# Patient Record
Sex: Male | Born: 1980 | Race: Black or African American | Hispanic: No | Marital: Single | Smoking: Current every day smoker
Health system: Southern US, Community
[De-identification: ages and names within clinical notes are randomized; demographics above are authoritative.]

## PROBLEM LIST (undated history)

## (undated) DIAGNOSIS — I10 Essential (primary) hypertension: Secondary | ICD-10-CM

---
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
## 2002-12-03 ENCOUNTER — Emergency Department (HOSPITAL_COMMUNITY): Admission: EM | Admit: 2002-12-03 | Discharge: 2002-12-03 | Payer: Self-pay | Admitting: Emergency Medicine

## 2009-10-01 ENCOUNTER — Emergency Department (HOSPITAL_COMMUNITY): Admission: EM | Admit: 2009-10-01 | Discharge: 2009-10-01 | Payer: Self-pay | Admitting: Emergency Medicine

## 2010-07-25 ENCOUNTER — Emergency Department (HOSPITAL_COMMUNITY)
Admission: EM | Admit: 2010-07-25 | Discharge: 2010-07-26 | Payer: Self-pay | Source: Home / Self Care | Admitting: Emergency Medicine

## 2010-07-26 LAB — HEPATIC FUNCTION PANEL
ALT: 30 U/L (ref 0–53)
AST: 24 U/L (ref 0–37)
Albumin: 4.2 g/dL (ref 3.5–5.2)
Alkaline Phosphatase: 78 U/L (ref 39–117)
Bilirubin, Direct: <0.1 mg/dL (ref 0.0–0.3)
Total Bilirubin: 0.5 mg/dL (ref 0.3–1.2)
Total Protein: 7.6 g/dL (ref 6.0–8.3)

## 2010-07-26 LAB — DIFFERENTIAL
Basophils Absolute: 0 10*3/uL (ref 0.0–0.1)
Basophils Relative: 0 % (ref 0–1)
Eosinophils Absolute: 0.1 10*3/uL (ref 0.0–0.7)
Eosinophils Relative: 1 % (ref 0–5)
Lymphocytes Relative: 33 % (ref 12–46)
Lymphs Abs: 3.8 10*3/uL (ref 0.7–4.0)
Monocytes Absolute: 0.7 10*3/uL (ref 0.1–1.0)
Monocytes Relative: 6 % (ref 3–12)
Neutro Abs: 6.9 10*3/uL (ref 1.7–7.7)
Neutrophils Relative %: 60 % (ref 43–77)

## 2010-07-26 LAB — BASIC METABOLIC PANEL
BUN: 6 mg/dL (ref 6–23)
CO2: 22 mEq/L (ref 19–32)
Calcium: 9.2 mg/dL (ref 8.4–10.5)
Chloride: 104 mEq/L (ref 96–112)
Creatinine, Ser: 1.02 mg/dL (ref 0.4–1.5)
GFR calc Af Amer: >60 mL/min (ref >60)
GFR calc non Af Amer: >60 mL/min (ref >60)
Glucose, Bld: 83 mg/dL (ref 70–99)
Potassium: 4.1 mEq/L (ref 3.5–5.1)
Sodium: 137 mEq/L (ref 135–145)

## 2010-07-26 LAB — POCT CARDIAC MARKERS
CKMB, poc: <1 ng/mL — ABNORMAL LOW (ref 1.0–8.0)
Myoglobin, poc: 54.1 ng/mL (ref 12–200)
Troponin i, poc: <0.05 ng/mL (ref 0.00–0.09)

## 2010-07-26 LAB — CBC
HCT: 44.1 % (ref 39.0–52.0)
Hemoglobin: 16.1 g/dL (ref 13.0–17.0)
MCH: 30.5 pg (ref 26.0–34.0)
MCHC: 36.5 g/dL — ABNORMAL HIGH (ref 30.0–36.0)
MCV: 83.5 fL (ref 78.0–100.0)
Platelets: ADEQUATE 10*3/uL (ref 150–400)
RBC: 5.28 MIL/uL (ref 4.22–5.81)
RDW: 12.6 % (ref 11.5–15.5)
WBC: 11.5 10*3/uL — ABNORMAL HIGH (ref 4.0–10.5)

## 2010-07-26 LAB — LIPASE, BLOOD: Lipase: 21 U/L (ref 11–59)

## 2010-10-01 LAB — LIPASE, BLOOD: Lipase: 16 U/L (ref 11–59)

## 2010-10-01 LAB — CBC
HCT: 45.2 % (ref 39.0–52.0)
Hemoglobin: 15.8 g/dL (ref 13.0–17.0)
MCHC: 35 g/dL (ref 30.0–36.0)
MCV: 89.1 fL (ref 78.0–100.0)
Platelets: 221 10*3/uL (ref 150–400)
RBC: 5.07 MIL/uL (ref 4.22–5.81)
RDW: 13.3 % (ref 11.5–15.5)
WBC: 8.2 10*3/uL (ref 4.0–10.5)

## 2010-10-01 LAB — COMPREHENSIVE METABOLIC PANEL
ALT: 19 U/L (ref 0–53)
AST: 24 U/L (ref 0–37)
Albumin: 3.8 g/dL (ref 3.5–5.2)
Alkaline Phosphatase: 74 U/L (ref 39–117)
BUN: 8 mg/dL (ref 6–23)
CO2: 24 mEq/L (ref 19–32)
Calcium: 8.8 mg/dL (ref 8.4–10.5)
Chloride: 102 mEq/L (ref 96–112)
Creatinine, Ser: 1.17 mg/dL (ref 0.4–1.5)
GFR calc Af Amer: >60 mL/min (ref >60)
GFR calc non Af Amer: >60 mL/min (ref >60)
Glucose, Bld: 100 mg/dL — ABNORMAL HIGH (ref 70–99)
Potassium: 3.9 mEq/L (ref 3.5–5.1)
Sodium: 133 mEq/L — ABNORMAL LOW (ref 135–145)
Total Bilirubin: 0.8 mg/dL (ref 0.3–1.2)
Total Protein: 7.2 g/dL (ref 6.0–8.3)

## 2010-10-01 LAB — POCT CARDIAC MARKERS
CKMB, poc: <1 ng/mL — ABNORMAL LOW (ref 1.0–8.0)
CKMB, poc: <1 ng/mL — ABNORMAL LOW (ref 1.0–8.0)
Myoglobin, poc: 59.8 ng/mL (ref 12–200)
Myoglobin, poc: 71.7 ng/mL (ref 12–200)
Troponin i, poc: <0.05 ng/mL (ref 0.00–0.09)
Troponin i, poc: <0.05 ng/mL (ref 0.00–0.09)

## 2010-10-01 LAB — DIFFERENTIAL
Basophils Absolute: 0 10*3/uL (ref 0.0–0.1)
Basophils Relative: 0 % (ref 0–1)
Eosinophils Absolute: 0 10*3/uL (ref 0.0–0.7)
Eosinophils Relative: 0 % (ref 0–5)
Lymphocytes Relative: 17 % (ref 12–46)
Lymphs Abs: 1.4 10*3/uL (ref 0.7–4.0)
Monocytes Absolute: 0.5 10*3/uL (ref 0.1–1.0)
Monocytes Relative: 6 % (ref 3–12)
Neutro Abs: 6.3 10*3/uL (ref 1.7–7.7)
Neutrophils Relative %: 77 % (ref 43–77)

## 2015-02-08 ENCOUNTER — Encounter (HOSPITAL_COMMUNITY): Payer: Self-pay | Admitting: Emergency Medicine

## 2015-02-08 ENCOUNTER — Emergency Department (INDEPENDENT_AMBULATORY_CARE_PROVIDER_SITE_OTHER)
Admission: EM | Admit: 2015-02-08 | Discharge: 2015-02-08 | Disposition: A | Discharge status: Home / Self Care | Payer: 59 | Source: Home / Self Care | Attending: Emergency Medicine | Admitting: Emergency Medicine

## 2015-02-08 DIAGNOSIS — R109 Unspecified abdominal pain: Secondary | ICD-10-CM

## 2015-02-08 DIAGNOSIS — R319 Hematuria, unspecified: Secondary | ICD-10-CM | POA: Diagnosis not present

## 2015-02-08 LAB — POCT URINALYSIS DIP (DEVICE)
Bilirubin Urine: NEGATIVE
Glucose, UA: NEGATIVE mg/dL
Ketones, ur: NEGATIVE mg/dL
Leukocytes, UA: NEGATIVE
Nitrite: NEGATIVE
Protein, ur: 30 mg/dL — AB
Specific Gravity, Urine: >=1.03 (ref 1.005–1.030)
Urobilinogen, UA: 0.2 mg/dL (ref 0.0–1.0)
pH: 6 (ref 5.0–8.0)

## 2015-02-08 MED ORDER — ONDANSETRON HCL 4 MG PO TABS: 4.0000 mg | ORAL_TABLET | Freq: Three times a day (TID) | ORAL | Status: DC | PRN

## 2015-02-08 MED ORDER — HYDROCODONE-ACETAMINOPHEN 5-325 MG PO TABS
1.0000 | ORAL_TABLET | Freq: Once | ORAL | Status: AC
  Administered 2015-02-08: 1 via ORAL

## 2015-02-08 MED ORDER — HYDROCODONE-ACETAMINOPHEN 5-325 MG PO TABS: 1.0000 | ORAL_TABLET | Freq: Four times a day (QID) | ORAL | Status: DC | PRN

## 2015-02-08 MED ORDER — ONDANSETRON 4 MG PO TBDP
4.0000 mg | ORAL_TABLET | Freq: Once | ORAL | Status: AC
  Administered 2015-02-08: 4 mg via ORAL

## 2015-02-08 MED ORDER — IBUPROFEN 800 MG PO TABS: 800.0000 mg | ORAL_TABLET | Freq: Three times a day (TID) | ORAL | Status: DC | PRN

## 2015-02-08 MED ORDER — HYDROCODONE-ACETAMINOPHEN 5-325 MG PO TABS
ORAL_TABLET | ORAL | Status: AC
  Filled 2015-02-08: qty 1

## 2015-02-08 MED ORDER — ONDANSETRON 4 MG PO TBDP
ORAL_TABLET | ORAL | Status: AC
  Filled 2015-02-08: qty 1

## 2015-02-08 MED ORDER — TAMSULOSIN HCL 0.4 MG PO CAPS: 0.4000 mg | ORAL_CAPSULE | Freq: Every day | ORAL | Status: DC

## 2015-02-08 NOTE — ED Notes (Signed)
C/o abd pain  States right back radiating lower stomach pain Does have nausea and urinating problems

## 2015-02-08 NOTE — Discharge Instructions (Signed)
You likely have a kidney stone. Use the Zofran every 8 hours as needed for nausea and vomiting. Take ibuprofen 800 mg 3 times a day as needed for pain. Use the Norco every 4-6 hours as needed for severe pain. Take Flomax daily for the next 2 weeks to help the stone pass. If your symptoms have not resolved in 1-2 weeks or are getting worse, please go to the emergency room.

## 2015-02-08 NOTE — ED Provider Notes (Signed)
CSN: [id number]     Arrival date & time 02/08/15  1632 History   First MD Initiated Contact with Patient 02/08/15 1642     Chief Complaint  Patient presents with  . Abdominal Pain   (Consider location/radiation/quality/duration/timing/severity/associated sxs/prior Treatment) HPI  He is a 34 year old man here for evaluation of abdominal pain.  He states this started this morning with pain in the left back that radiated around to the left stomach. Pain comes and goes. He describes it as relatively mild. This is associated with nausea and vomiting. He also reports a smaller than usual amount of urine this morning. No fevers or chills. No dysuria or gross hematuria.  History reviewed. No pertinent past medical history. No past surgical history on file. History reviewed. No pertinent family history. History  Substance Use Topics  . Smoking status: Not on file  . Smokeless tobacco: Not on file  . Alcohol Use: Not on file    Review of Systems As in history of present illness Allergies  Review of patient's allergies indicates no known allergies.  Home Medications   Prior to Admission medications   Medication Sig Start Date End Date Taking? Authorizing Provider  HYDROcodone-acetaminophen (NORCO) 5-325 MG per tablet Take 1 tablet by mouth every 6 (six) hours as needed for moderate pain. 02/08/15   Charm Rings, MD  ibuprofen (ADVIL,MOTRIN) 800 MG tablet Take 1 tablet (800 mg total) by mouth every 8 (eight) hours as needed for moderate pain. 02/08/15   Charm Rings, MD  ondansetron (ZOFRAN) 4 MG tablet Take 1 tablet (4 mg total) by mouth every 8 (eight) hours as needed for nausea or vomiting. 02/08/15   Charm Rings, MD  tamsulosin (FLOMAX) 0.4 MG CAPS capsule Take 1 capsule (0.4 mg total) by mouth daily. 02/08/15   Charm Rings, MD   BP 146/99 mmHg  Pulse 88  Temp(Src) 98.5 F (36.9 C) (Oral)  Resp 16  SpO2 99% Physical Exam  Constitutional: He is oriented to person, place, and time. He  appears well-developed and well-nourished. No distress.  Neck: Neck supple.  Cardiovascular: Normal rate, regular rhythm and normal heart sounds.   No murmur heard. Pulmonary/Chest: Effort normal and breath sounds normal. No respiratory distress. He has no wheezes. He has no rales.  Abdominal: Soft. Bowel sounds are normal. He exhibits no distension. There is tenderness (along left abdomen). There is no rebound and no guarding.  Mild CVA tenderness on left  Neurological: He is alert and oriented to person, place, and time.    ED Course  Procedures (including critical care time) Labs Review Labs Reviewed  POCT URINALYSIS DIP (DEVICE) - Abnormal; Notable for the following:    Hgb urine dipstick SMALL (*)    Protein, ur 30 (*)    All other components within normal limits    Imaging Review No results found.   MDM   1. Left flank pain   2. Hematuria    I suspect he has a kidney stone. Zofran 4 mg ODT and Norco 5-325 milligrams by mouth given. Prescriptions for Zofran, ibuprofen, Norco, Flomax given. Return precautions reviewed.    Charm Rings, MD 02/08/15 [phone]

## 2015-04-21 ENCOUNTER — Emergency Department (INDEPENDENT_AMBULATORY_CARE_PROVIDER_SITE_OTHER)
Admission: EM | Admit: 2015-04-21 | Discharge: 2015-04-21 | Disposition: A | Discharge status: Home / Self Care | Payer: 59 | Source: Home / Self Care

## 2015-04-21 ENCOUNTER — Encounter (HOSPITAL_COMMUNITY): Payer: Self-pay | Admitting: *Deleted

## 2015-04-21 DIAGNOSIS — K529 Noninfective gastroenteritis and colitis, unspecified: Secondary | ICD-10-CM | POA: Diagnosis not present

## 2015-04-21 LAB — POCT URINALYSIS DIP (DEVICE)
Glucose, UA: NEGATIVE mg/dL
Ketones, ur: NEGATIVE mg/dL
Leukocytes, UA: NEGATIVE
Nitrite: NEGATIVE
Protein, ur: 30 mg/dL — AB
Specific Gravity, Urine: >=1.03 (ref 1.005–1.030)
Urobilinogen, UA: 0.2 mg/dL (ref 0.0–1.0)
pH: 6 (ref 5.0–8.0)

## 2015-04-21 NOTE — Discharge Instructions (Signed)

## 2015-04-21 NOTE — ED Notes (Signed)
Pt  Reports  Symptoms  Of abdominal  Pain       Nausea           Vomiting  And  Some  Slight  Diarrhea         Pt  Reports  The  Nausea  Began  Yesterday

## 2015-04-21 NOTE — ED Provider Notes (Signed)
CSN: [id number]     Arrival date & time 04/21/15  1733 History   None    Chief Complaint  Patient presents with  . Abdominal Pain   (Consider location/radiation/quality/duration/timing/severity/associated sxs/prior Treatment) HPI Comments: 34 year old African-American male presents to urgent care complaining of abdominal pain associated with 2 episodes of loose stool that started today. Notably the patient states that he began to feel nauseous yesterday and awoke this morning with 1 episode of nonbloody nonbilious emesis. He also admits to urinary hesitancy but denies dysuria. The patient describes white streaking to his urine that "looks like a warm". His abdominal pain alternates between sharp and dull. She states that it feels slightly different than pain he experienced in the past with kidney stones. He denies fevers.  Patient is a 34 y.o. male presenting with abdominal pain. The history is provided by the patient.  Abdominal Pain Pain location:  Suprapubic Pain radiates to:  Does not radiate Pain severity:  Moderate Onset quality: Colicky. Progression:  Worsening (Gradually) Chronicity:  New Relieved by:  None tried Worsened by:  Nothing tried Associated symptoms: diarrhea, nausea and vomiting   Associated symptoms: no chest pain, no chills, no dysuria, no fever, no hematuria and no shortness of breath   Diarrhea:    Diarrhea characteristics: Nonbloody.   Number of occurrences:  2   History reviewed. No pertinent past medical history. History reviewed. No pertinent past surgical history. History reviewed. No pertinent family history. Social History  Substance Use Topics  . Smoking status: Current Every Day Smoker  . Smokeless tobacco: None  . Alcohol Use: Yes    Review of Systems  Constitutional: Negative for fever and chills.  Respiratory: Negative for shortness of breath.   Cardiovascular: Negative for chest pain.  Gastrointestinal: Positive for nausea, vomiting,  abdominal pain and diarrhea.  Genitourinary: Negative for dysuria, hematuria and flank pain.  Musculoskeletal: Negative for arthralgias.  Skin: Negative for rash.    Allergies  Review of patient's allergies indicates no known allergies.  Home Medications   Prior to Admission medications   Medication Sig Start Date End Date Taking? Authorizing Provider  HYDROcodone-acetaminophen (NORCO) 5-325 MG per tablet Take 1 tablet by mouth every 6 (six) hours as needed for moderate pain. 02/08/15   Charm RingsErin J Honig, MD  ibuprofen (ADVIL,MOTRIN) 800 MG tablet Take 1 tablet (800 mg total) by mouth every 8 (eight) hours as needed for moderate pain. 02/08/15   Charm RingsErin J Honig, MD  ondansetron (ZOFRAN) 4 MG tablet Take 1 tablet (4 mg total) by mouth every 8 (eight) hours as needed for nausea or vomiting. 02/08/15   Charm RingsErin J Honig, MD  tamsulosin (FLOMAX) 0.4 MG CAPS capsule Take 1 capsule (0.4 mg total) by mouth daily. 02/08/15   Charm RingsErin J Honig, MD   Meds Ordered and Administered this Visit  Medications - No data to display  BP 142/93 mmHg  Pulse 84  Temp(Src) 98.1 F (36.7 C) (Oral)  Resp 16  SpO2 100% No data found.   Physical Exam  ED Course  Procedures (including critical care time)  Labs Review Labs Reviewed  POCT URINALYSIS DIP (DEVICE) - Abnormal; Notable for the following:    Bilirubin Urine SMALL (*)    Hgb urine dipstick TRACE (*)    Protein, ur 30 (*)    All other components within normal limits    Imaging Review No results found.   Visual Acuity Review  Right Eye Distance:   Left Eye Distance:   Bilateral  Distance:    Right Eye Near:   Left Eye Near:    Bilateral Near:         MDM   1. Gastroenteritis    BRAT diet; drink plenty of water. Return to clinic if you are unable to keep food or water down for 36 hours. May use Imodium for symptom relief as diarrhea is unlikely bacterial.    Arnaldo Natal, MD 04/21/15 Ernestina [place]

## 2015-05-06 ENCOUNTER — Emergency Department (INDEPENDENT_AMBULATORY_CARE_PROVIDER_SITE_OTHER)
Admission: EM | Admit: 2015-05-06 | Discharge: 2015-05-06 | Disposition: A | Discharge status: Home / Self Care | Payer: 59 | Source: Home / Self Care | Attending: Family Medicine | Admitting: Family Medicine

## 2015-05-06 ENCOUNTER — Encounter (HOSPITAL_COMMUNITY): Payer: Self-pay | Admitting: Emergency Medicine

## 2015-05-06 DIAGNOSIS — A084 Viral intestinal infection, unspecified: Secondary | ICD-10-CM

## 2015-05-06 MED ORDER — ONDANSETRON 4 MG PO TBDP
ORAL_TABLET | ORAL | Status: AC
  Filled 2015-05-06: qty 2

## 2015-05-06 MED ORDER — ONDANSETRON HCL 4 MG PO TABS: 4.0000 mg | ORAL_TABLET | Freq: Three times a day (TID) | ORAL | Status: DC | PRN

## 2015-05-06 MED ORDER — ONDANSETRON 4 MG PO TBDP
8.0000 mg | ORAL_TABLET | Freq: Once | ORAL | Status: AC
  Administered 2015-05-06: 8 mg via ORAL

## 2015-05-06 NOTE — Discharge Instructions (Signed)
You likely have developed a viral got infection called gastroenteritis. This typically goes away within 1-7 days total. Please use the Zofran as needed for upset stomach and vomiting. Please stay hydrated with plenty of fluids especially electrolyte fluids. His go to the emergency room if your symptoms get significantly worse.   Viral Gastroenteritis Viral gastroenteritis is also known as stomach flu. This condition affects the stomach and intestinal tract. It can cause sudden diarrhea and vomiting. The illness typically lasts 3 to 8 days. Most people develop an immune response that eventually gets rid of the virus. While this natural response develops, the virus can make you quite ill. CAUSES  Many different viruses can cause gastroenteritis, such as rotavirus or noroviruses. You can catch one of these viruses by consuming contaminated food or water. You may also catch a virus by sharing utensils or other personal items with an infected person or by touching a contaminated surface. SYMPTOMS  The most common symptoms are diarrhea and vomiting. These problems can cause a severe loss of body fluids (dehydration) and a body salt (electrolyte) imbalance. Other symptoms may include:  Fever.  Headache.  Fatigue.  Abdominal pain. DIAGNOSIS  Your caregiver can usually diagnose viral gastroenteritis based on your symptoms and a physical exam. A stool sample may also be taken to test for the presence of viruses or other infections. TREATMENT  This illness typically goes away on its own. Treatments are aimed at rehydration. The most serious cases of viral gastroenteritis involve vomiting so severely that you are not able to keep fluids down. In these cases, fluids must be given through an intravenous line (IV). HOME CARE INSTRUCTIONS   Drink enough fluids to keep your urine clear or pale yellow. Drink small amounts of fluids frequently and increase the amounts as tolerated.  Ask your caregiver for  specific rehydration instructions.  Avoid:  Foods high in sugar.  Alcohol.  Carbonated drinks.  Tobacco.  Juice.  Caffeine drinks.  Extremely hot or cold fluids.  Fatty, greasy foods.  Too much intake of anything at one time.  Dairy products until 24 to 48 hours after diarrhea stops.  You may consume probiotics. Probiotics are active cultures of beneficial bacteria. They may lessen the amount and number of diarrheal stools in adults. Probiotics can be found in yogurt with active cultures and in supplements.  Wash your hands well to avoid spreading the virus.  Only take over-the-counter or prescription medicines for pain, discomfort, or fever as directed by your caregiver. Do not give aspirin to children. Antidiarrheal medicines are not recommended.  Ask your caregiver if you should continue to take your regular prescribed and over-the-counter medicines.  Keep all follow-up appointments as directed by your caregiver. SEEK IMMEDIATE MEDICAL CARE IF:   You are unable to keep fluids down.  You do not urinate at least once every 6 to 8 hours.  You develop shortness of breath.  You notice blood in your stool or vomit. This may look like coffee grounds.  You have abdominal pain that increases or is concentrated in one small area (localized).  You have persistent vomiting or diarrhea.  You have a fever.  The patient is a child younger than 3 months, and he or she has a fever.  The patient is a child older than 3 months, and he or she has a fever and persistent symptoms.  The patient is a child older than 3 months, and he or she has a fever and symptoms suddenly get  worse.  The patient is a baby, and he or she has no tears when crying. MAKE SURE YOU:   Understand these instructions.  Will watch your condition.  Will get help right away if you are not doing well or get worse.   This information is not intended to replace advice given to you by your health care  provider. Make sure you discuss any questions you have with your health care provider.   Document Released: 06/25/2005 Document Revised: 09/17/2011 Document Reviewed: 04/11/2011 Elsevier Interactive Patient Education [employer]2016 [employer]

## 2015-05-06 NOTE — ED Notes (Addendum)
Pt here today with mid lower abdominal pain, nausea and vomiting x2 this morning.  He still has the pain this afternoon.  He states his last BM was last night, regular and formed.  He denies any other issues.  He woke up with the pain, ate breakfast and vomited twice.  He has been able to keep Gatorade down.

## 2015-05-06 NOTE — ED Provider Notes (Signed)
CSN: [id number]     Arrival date & time 05/06/15  1327 [id number]   First MD Initiated Contact with Patient 05/06/15 1414     Chief Complaint  Patient presents with  . Abdominal Pain  . Emesis  . Nausea   (Consider location/radiation/quality/duration/timing/severity/associated sxs/prior Treatment) HPI   Abdominal discomfort, nausea, vomiting, and diarrhea. Started 1 day ago. Intermittent. Getting worse. Emesis with all oral intake. Able to tolerate only small amounts of Gatorade. Denies any bloody stools, hematemesis, fevers, dysuria, frequency. Patient still with slight suprapubic tenderness from previous urinary stone. Denies chest pain, shortness breath, palpitations, neck stiffness, headache.   History reviewed. No pertinent past medical history. History reviewed. No pertinent past surgical history. History reviewed. No pertinent family history. Social History  Substance Use Topics  . Smoking status: Current Every Day Smoker  . Smokeless tobacco: None  . Alcohol Use: Yes    Review of Systems   Per HPI with all other pertinent systems negative.   Allergies  Review of patient's allergies indicates no known allergies.  Home Medications   Prior to Admission medications   Medication Sig Start Date End Date Taking? Authorizing Provider  HYDROcodone-acetaminophen (NORCO) 5-325 MG per tablet Take 1 tablet by mouth every 6 (six) hours as needed for moderate pain. 02/08/15   Charm RingsErin J Honig, MD  ibuprofen (ADVIL,MOTRIN) 800 MG tablet Take 1 tablet (800 mg total) by mouth every 8 (eight) hours as needed for moderate pain. 02/08/15   Charm RingsErin J Honig, MD  ondansetron (ZOFRAN) 4 MG tablet Take 1-2 tablets (4-8 mg total) by mouth every 8 (eight) hours as needed for nausea or vomiting. 05/06/15   Ozella Rocksavid J Merrell, MD  tamsulosin (FLOMAX) 0.4 MG CAPS capsule Take 1 capsule (0.4 mg total) by mouth daily. 02/08/15   Charm RingsErin J Honig, MD   Meds Ordered and Administered this Visit   Medications  ondansetron  (ZOFRAN-ODT) disintegrating tablet 8 mg (not administered)    BP 156/102 mmHg  Pulse 82  Temp(Src) 98.3 F (36.8 C) (Oral)  Resp 16  SpO2 99% No data found.   Physical Exam Physical Exam  Constitutional: oriented to person, place, and time. appears well-developed and well-nourished. No distress.  HENT:  Head: Normocephalic and atraumatic.  Eyes: EOMI. PERRL.  Neck: Normal range of motion.  Cardiovascular: RRR, no m/r/g, 2+ distal pulses,  Pulmonary/Chest: Effort normal and breath sounds normal. No respiratory distress.  Abdominal: Soft. Bowel sounds are normal. NonTTP, no distension.  Musculoskeletal: Normal range of motion. Non ttp, no effusion.  Neurological: alert and oriented to person, place, and time.  Skin: Skin is warm. No rash noted. non diaphoretic.  Psychiatric: normal mood and affect. behavior is normal. Judgment and thought content normal.   ED Course  Procedures (including critical care time)  Labs Review Labs Reviewed - No data to display  Imaging Review No results found.   Visual Acuity Review  Right Eye Distance:   Left Eye Distance:   Bilateral Distance:    Right Eye Near:   Left Eye Near:    Bilateral Near:         MDM   1. Viral gastroenteritis    Zofran 8 mg ODT given in clinic. Continue Zofran, fluids, rest. Discussed how this condition will likely resolve on its own over the course of the next 1-5 days. Provided. No evidence of recurrence or continuing. Elevation blood pressure noted secondary to patient's acute illness. Further management at this time.   Ozella Rocksavid J Merrell,  MD 05/06/15 1443

## 2015-08-14 ENCOUNTER — Other Ambulatory Visit (HOSPITAL_COMMUNITY)
Admission: RE | Admit: 2015-08-14 | Discharge: 2015-08-14 | Disposition: A | Discharge status: Home / Self Care | Payer: 59 | Source: Ambulatory Visit | Attending: Family Medicine | Admitting: Family Medicine

## 2015-08-14 ENCOUNTER — Emergency Department (INDEPENDENT_AMBULATORY_CARE_PROVIDER_SITE_OTHER)
Admission: EM | Admit: 2015-08-14 | Discharge: 2015-08-14 | Disposition: A | Discharge status: Home / Self Care | Payer: 59 | Source: Home / Self Care | Attending: Family Medicine | Admitting: Family Medicine

## 2015-08-14 DIAGNOSIS — Z113 Encounter for screening for infections with a predominantly sexual mode of transmission: Secondary | ICD-10-CM | POA: Diagnosis not present

## 2015-08-14 DIAGNOSIS — R1013 Epigastric pain: Secondary | ICD-10-CM

## 2015-08-14 DIAGNOSIS — R369 Urethral discharge, unspecified: Secondary | ICD-10-CM

## 2015-08-14 LAB — POCT URINALYSIS DIP (DEVICE)
Glucose, UA: NEGATIVE mg/dL
Ketones, ur: NEGATIVE mg/dL
Leukocytes, UA: NEGATIVE
NITRITE: NEGATIVE
PH: 6 (ref 5.0–8.0)
PROTEIN: NEGATIVE mg/dL
Specific Gravity, Urine: 1.025 (ref 1.005–1.030)
Urobilinogen, UA: 0.2 mg/dL (ref 0.0–1.0)

## 2015-08-14 MED ORDER — PANTOPRAZOLE SODIUM 20 MG PO TBEC: 20.0000 mg | DELAYED_RELEASE_TABLET | Freq: Every day | ORAL | Status: DC

## 2015-08-14 NOTE — Discharge Instructions (Signed)
It was nice seeing you today. I am sorry about your belly pain. You likely have gastritis causing your pain. Please start Prilosec for 4 wks. If no improvement or if symptoms worsens see your PCP soon.

## 2015-08-14 NOTE — ED Provider Notes (Addendum)
CSN: [id number]     Arrival date & time 08/14/15  1434 History   First MD Initiated Contact with Patient 08/14/15 1543     Chief Complaint  Patient presents with  . Abdominal Pain   (Consider location/radiation/quality/duration/timing/severity/associated sxs/prior Treatment) Patient is a 35 y.o. male presenting with abdominal pain and penile discharge. The history is provided by the patient. No language interpreter was used.  Abdominal Pain Pain location: right middl quadrant. Pain quality: dull and sharp   Pain radiates to:  Does not radiate Pain severity:  Mild Onset quality:  Gradual Duration:  24 hours Timing:  Intermittent Progression:  Unchanged Chronicity:  New Context: sick contacts   Context: not recent illness and not suspicious food intake   Context comment:  He ate pizza night before. Co-worker had URI Associated symptoms: nausea and vomiting   Associated symptoms: no anorexia, no belching, no constipation, no diarrhea, no dysuria, no fever, no hematuria, no shortness of breath and no sore throat   Associated symptoms comment:  He had one episode of vomiting this morning and thereafter he had dry heaving episodes Penile Discharge This is a new problem. The current episode started 6 to 12 hours ago. Episode frequency: occured once today. The problem has been gradually improving. Associated symptoms include abdominal pain. Pertinent negatives include no shortness of breath. Nothing aggravates the symptoms. Nothing relieves the symptoms.  Denies recent unprotected sex. Had Gonorrhea few years ago.  No past medical history on file. No past surgical history on file. No family history on file. Social History  Substance Use Topics  . Smoking status: Current Every Day Smoker  . Smokeless tobacco: Not on file  . Alcohol Use: Yes    Review of Systems  Constitutional: Negative for fever.  HENT: Negative for sore throat.   Respiratory: Negative.  Negative for shortness of  breath.   Cardiovascular: Negative.   Gastrointestinal: Positive for nausea, vomiting and abdominal pain. Negative for diarrhea, constipation and anorexia.  Genitourinary: Positive for discharge. Negative for dysuria and hematuria.  All other systems reviewed and are negative.   Allergies  Review of patient's allergies indicates no known allergies.  Home Medications   Prior to Admission medications   Medication Sig Start Date End Date Taking? Authorizing Provider  HYDROcodone-acetaminophen (NORCO) 5-325 MG per tablet Take 1 tablet by mouth every 6 (six) hours as needed for moderate pain. 02/08/15   Charm Rings, MD  ibuprofen (ADVIL,MOTRIN) 800 MG tablet Take 1 tablet (800 mg total) by mouth every 8 (eight) hours as needed for moderate pain. 02/08/15   Charm Rings, MD  ondansetron (ZOFRAN) 4 MG tablet Take 1-2 tablets (4-8 mg total) by mouth every 8 (eight) hours as needed for nausea or vomiting. 05/06/15   Ozella Rocks, MD  tamsulosin (FLOMAX) 0.4 MG CAPS capsule Take 1 capsule (0.4 mg total) by mouth daily. 02/08/15   Charm Rings, MD   Meds Ordered and Administered this Visit  Medications - No data to display  BP 155/90 mmHg  Pulse 97  Temp(Src) 98.7 F (37.1 C) (Oral)  Resp 16  SpO2 99% No data found.   Physical Exam  Constitutional: He appears well-developed. No distress.  Cardiovascular: Normal rate, regular rhythm, normal heart sounds and intact distal pulses.   No murmur heard. Pulmonary/Chest: Effort normal and breath sounds normal. No respiratory distress. He has no wheezes.  Abdominal: Normal appearance. There is no hepatosplenomegaly. There is tenderness in the epigastric area. There is  no rebound.  Nursing note and vitals reviewed.   ED Course  Procedures (including critical care time)  Labs Review Labs Reviewed - No data to display  Imaging Review No results found.   Visual Acuity Review  Right Eye Distance:   Left Eye Distance:   Bilateral Distance:     Right Eye Near:   Left Eye Near:    Bilateral Near:     Urinalysis    Component Value Date/Time   LABSPEC >=1.030 04/21/2015 1822   PHURINE 6.0 04/21/2015 1822   GLUCOSEU NEGATIVE 04/21/2015 1822   HGBUR TRACE* 04/21/2015 1822   BILIRUBINUR SMALL* 04/21/2015 1822   KETONESUR NEGATIVE 04/21/2015 1822   PROTEINUR 30* 04/21/2015 1822   UROBILINOGEN 0.2 04/21/2015 1822   NITRITE NEGATIVE 04/21/2015 1822   LEUKOCYTESUR NEGATIVE 04/21/2015 1822         MDM  No diagnosis found. Epigastric abdominal pain  Penile discharge  Patient likely has gastritis. Protonix prescribed. Avoid trigger food. Follow up with PCP in few days if no improvement or sooner if symptoms worsen.   Risk for STI low since he uses protection regularly with his long term girl friend. Urine cytology obtained for GC/Chlamydia. UA also checked with trace blood. I will contact him with other test result.      Doreene Eland, MD 08/14/15 [phone]

## 2015-08-14 NOTE — ED Notes (Signed)
Patient complains of stomach ache nausea and some vomiting that Started this am

## 2015-08-15 LAB — URINE CYTOLOGY ANCILLARY ONLY
Chlamydia: NEGATIVE
Neisseria Gonorrhea: NEGATIVE

## 2015-08-20 ENCOUNTER — Telehealth (HOSPITAL_COMMUNITY): Payer: Self-pay | Admitting: Emergency Medicine

## 2015-08-20 NOTE — ED Notes (Signed)
Called pt and notified of recent lab results from visit 2/5 Pt ID'd properly... Reports feeling better and sx have subsided  Per Dr. Dayton Scrape,   Please let patient know that tests for chlamydia/gonorrhea were negative. LM   Adv pt if sx are not getting better to return  Education on safe sex given Pt verb understanding.

## 2015-09-21 ENCOUNTER — Emergency Department (HOSPITAL_COMMUNITY)
Admission: EM | Admit: 2015-09-21 | Discharge: 2015-09-22 | Disposition: A | Discharge status: Home / Self Care | Payer: 59 | Attending: Emergency Medicine | Admitting: Emergency Medicine

## 2015-09-21 ENCOUNTER — Emergency Department (HOSPITAL_COMMUNITY): Payer: 59

## 2015-09-21 ENCOUNTER — Encounter (HOSPITAL_COMMUNITY): Payer: Self-pay | Admitting: Emergency Medicine

## 2015-09-21 DIAGNOSIS — R079 Chest pain, unspecified: Secondary | ICD-10-CM | POA: Diagnosis not present

## 2015-09-21 DIAGNOSIS — R200 Anesthesia of skin: Secondary | ICD-10-CM | POA: Diagnosis not present

## 2015-09-21 DIAGNOSIS — R42 Dizziness and giddiness: Secondary | ICD-10-CM | POA: Insufficient documentation

## 2015-09-21 DIAGNOSIS — F17200 Nicotine dependence, unspecified, uncomplicated: Secondary | ICD-10-CM | POA: Diagnosis not present

## 2015-09-21 DIAGNOSIS — R0602 Shortness of breath: Secondary | ICD-10-CM | POA: Insufficient documentation

## 2015-09-21 DIAGNOSIS — R1013 Epigastric pain: Secondary | ICD-10-CM | POA: Diagnosis not present

## 2015-09-21 DIAGNOSIS — R110 Nausea: Secondary | ICD-10-CM | POA: Insufficient documentation

## 2015-09-21 DIAGNOSIS — Z79899 Other long term (current) drug therapy: Secondary | ICD-10-CM | POA: Insufficient documentation

## 2015-09-21 LAB — BASIC METABOLIC PANEL
ANION GAP: 10 (ref 5–15)
BUN: 12 mg/dL (ref 6–20)
CALCIUM: 9.7 mg/dL (ref 8.9–10.3)
CO2: 28 mmol/L (ref 22–32)
CREATININE: 1.1 mg/dL (ref 0.61–1.24)
Chloride: 102 mmol/L (ref 101–111)
GLUCOSE: 90 mg/dL (ref 65–99)
POTASSIUM: 4 mmol/L (ref 3.5–5.1)
SODIUM: 140 mmol/L (ref 135–145)

## 2015-09-21 LAB — HEPATIC FUNCTION PANEL
ALBUMIN: 4 g/dL (ref 3.5–5.0)
ALT: 18 U/L (ref 17–63)
AST: 24 U/L (ref 15–41)
Alkaline Phosphatase: 81 U/L (ref 38–126)
BILIRUBIN DIRECT: 0.2 mg/dL (ref 0.1–0.5)
BILIRUBIN INDIRECT: 0.3 mg/dL (ref 0.3–0.9)
BILIRUBIN TOTAL: 0.5 mg/dL (ref 0.3–1.2)
Total Protein: 6.9 g/dL (ref 6.5–8.1)

## 2015-09-21 LAB — I-STAT TROPONIN, ED: TROPONIN I, POC: 0.01 ng/mL (ref 0.00–0.08)

## 2015-09-21 LAB — CBC
HCT: 43.6 % (ref 39.0–52.0)
HEMOGLOBIN: 14.9 g/dL (ref 13.0–17.0)
MCH: 29.8 pg (ref 26.0–34.0)
MCHC: 34.2 g/dL (ref 30.0–36.0)
MCV: 87.2 fL (ref 78.0–100.0)
PLATELETS: 266 10*3/uL (ref 150–400)
RBC: 5 MIL/uL (ref 4.22–5.81)
RDW: 12.6 % (ref 11.5–15.5)
WBC: 10.4 10*3/uL (ref 4.0–10.5)

## 2015-09-21 LAB — LIPASE, BLOOD: Lipase: 24 U/L (ref 11–51)

## 2015-09-21 MED ORDER — GI COCKTAIL ~~LOC~~
30.0000 mL | Freq: Once | ORAL | Status: AC
  Administered 2015-09-22: 30 mL via ORAL
  Filled 2015-09-21: qty 30

## 2015-09-21 NOTE — ED Provider Notes (Signed)
CSN: [id number]     Arrival date & time 09/21/15  2244 History  By signing my name below, I, Maxwell Glover, attest that this documentation has been prepared under the direction and in the presence of [name], MD Electronically Signed: [name], ED Scribe 09/21/2015 at 12:06 AM.  Chief Complaint  Patient presents with  . Chest Pain   The history is provided by the patient. No language interpreter was used.    HPI Comments: Maxwell Glover is a 35 y.o. 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 Pain began as a stomach ache and increased thereafter. However, it has decreased within the past hour. Pt also c/o some arm numbness, intermittent SOB, light-headedness and nausea. At first, pain was exacerbated by certain foods, however pain is now exacerbated upon drinking water, as well as other various stimuli. Pain is also exacerbated by standing up, and is occasionally randomly ameliorated. Pt tried taking tylenol and an OTC antacid medication with no relief. Pt denies any cough, fever, melena, hematochezia, or leg swelling. Pt denies any excessive use of medication. Pt does not drink alcohol. Pt has no fhx of pmhx of cardiac issues or PE/DVT or blood clots. Pt has not had any long distance travel recently. Pt states that he may have a Nevaquin allergy. Pt does not have a PCP.    History reviewed. No pertinent past medical history. History reviewed. No pertinent past surgical history. No family history on file. Social History  Substance Use Topics  . Smoking status: Current Every Day Smoker  . Smokeless tobacco: None  . Alcohol Use: Yes    Review of Systems  10 Systems reviewed and all are negative for acute change except as noted in the HPI.  Allergies  Review of patient's allergies  indicates no known allergies.  Home Medications   Prior to Admission medications   Medication Sig Start Date End Date Taking? Authorizing Provider  Cimetidine (ACID RELIEF PO) Take 1 tablet by mouth daily.   Yes Historical Provider, MD  Multiple Vitamin (MULTIVITAMIN WITH MINERALS) TABS tablet Take 1 tablet by mouth daily.   Yes Historical Provider, MD  Probiotic Product (DIGESTIVE ADVANTAGE PO) Take 1 tablet by mouth daily.   Yes Historical Provider, MD   BP 154/101 mmHg  Pulse 98  Temp(Src) 98.4 F (36.9 C) (Oral)  Resp 16  Ht 5\' 10"  (1.778 m)  Wt 216 lb (97.977 kg)  BMI 30.99 kg/m2  SpO2 100% Physical Exam  Constitutional: He is oriented to person, place, and time. He appears well-developed and well-nourished. No distress.  HENT:  Head: Normocephalic and atraumatic.  Moist mucous membranes  Eyes: Conjunctivae are normal. Pupils are equal, round, and reactive to light.  Neck: Neck supple.  Cardiovascular: Normal rate, regular rhythm and normal heart sounds.   No murmur heard. Pulmonary/Chest: Effort normal and breath sounds normal.  Abdominal: Soft. Bowel sounds are normal. He exhibits no distension. There is tenderness (Mid-epigastric TTP). There is no rebound and no guarding.  Musculoskeletal: He exhibits no edema.  Neurological: He is alert and oriented to person, place, and time.  Fluent speech  Skin: Skin is warm and dry.  Psychiatric: He has a normal mood and affect. Judgment normal.  Nursing note and vitals reviewed.   ED Course  Procedures  DIAGNOSTIC STUDIES: Oxygen Saturation is  100% on RA, normal by my interpretation.    COORDINATION OF CARE:  11:35 PM-Discussed treatment plan which includes DG Chest, hepatic function panel, blood work, EKG, and cardiac monitoring  with pt at bedside and pt agreed to plan.   Labs Review Labs Reviewed  BASIC METABOLIC PANEL  CBC  LIPASE, BLOOD  HEPATIC FUNCTION PANEL  I-STAT TROPOININ, ED    Imaging Review Dg Chest  2 View  09/21/2015  CLINICAL DATA:  Centralized chest pain today with some shortness of breath. Arm pain. EXAM: CHEST  2 VIEW COMPARISON:  Chest x-ray dated 07/25/2010. FINDINGS: Study is slightly hypoinspiratory with crowding of the perihilar and bibasilar bronchovascular markings. Given the low lung volumes, lungs are clear. No confluent opacity to suggest a developing pneumonia. No pleural effusion or pneumothorax seen. Osseous and soft tissue structures about the chest are unremarkable. IMPRESSION: No evidence of acute cardiopulmonary abnormality. Electronically Signed   By: Bary Richard M.D.   On: 09/21/2015 23:17   I have personally reviewed and evaluated these lab results as part of my medical decision-making.   EKG Interpretation   Date/Time:  Wednesday September 21 2015 22:47:10 EDT Ventricular Rate:  104 PR Interval:  126 QRS Duration: 92 QT Interval:  334 QTC Calculation: 439 R Axis:   54 Text Interpretation:  Sinus tachycardia Cannot rule out Anterior infarct ,  age undetermined Abnormal ECG similar to previous ekg Confirmed by LITTLE  MD, RACHEL [phone]) on 09/21/2015 11:15:50 PM     Medications  gi cocktail (Maalox,Lidocaine,Donnatal) (30 mLs Oral Given 09/22/15 0011)    MDM   Final diagnoses:  Epigastric pain   PT p/w a few days of intermittent epigastric pain radiating into his left arm that has not been relieved by tylenol or OTC medications. On exam, he was well-appearing. Vital signs notable for mild hypertension. EKG showed sinus tachycardia but otherwise unremarkable. He had midepigastric tenderness which he states reproduced the pain that he has been having. Gave the patient a GI cocktail and obtained above lab work including troponin, LFTs and lipase. Chest x-ray was unremarkable. All lab work was normal.  Patient has no risk factors for blood clots therefore PE seems very unlikely. He also has no risk factors for early heart disease and his HEART score is <3. Given  that his pain is reproducible on abdominal exam, I suspect GI etiology such as peptic ulcer disease. Pt states slight improvement after GI cocktail.  I've instructed on treatment at home and provided with prescription for PPI. Discussed supportive care as well as dietary modifications. Instructed to establish care with PCP for reevaluation of his symptoms and possible referral to a gastroenterologist if not improved. Also informed patient of his elevated blood pressure and need for reevaluation. I reviewed return precautions including any worsening pain, intractable vomiting, or worsening chest pain/shortness of breath. Patient voiced understanding and was discharged in satisfactory condition.  I personally performed the services described in this documentation, which was scribed in my presence. The recorded information has been reviewed and is accurate.      Laurence Spates, MD 09/22/15 [phone]

## 2015-09-21 NOTE — ED Notes (Signed)
Pt. reports central chest/epigastric pain radiating to left upper arm with mild SOB and occasional dry cough onset this week , denies nausea or diaphoresis .

## 2015-09-22 MED ORDER — OMEPRAZOLE 20 MG PO CPDR: 20.0000 mg | DELAYED_RELEASE_CAPSULE | Freq: Every day | ORAL | Status: AC

## 2015-09-22 MED ORDER — SUCRALFATE 1 G PO TABS: 1.0000 g | ORAL_TABLET | Freq: Three times a day (TID) | ORAL | Status: DC

## 2016-02-01 ENCOUNTER — Encounter (HOSPITAL_COMMUNITY): Payer: Self-pay | Admitting: Emergency Medicine

## 2016-02-01 ENCOUNTER — Emergency Department (HOSPITAL_COMMUNITY)
Admission: EM | Admit: 2016-02-01 | Discharge: 2016-02-01 | Disposition: A | Discharge status: Home / Self Care | Payer: 59 | Attending: Emergency Medicine | Admitting: Emergency Medicine

## 2016-02-01 DIAGNOSIS — K047 Periapical abscess without sinus: Secondary | ICD-10-CM

## 2016-02-01 DIAGNOSIS — K0889 Other specified disorders of teeth and supporting structures: Secondary | ICD-10-CM | POA: Diagnosis present

## 2016-02-01 DIAGNOSIS — F17200 Nicotine dependence, unspecified, uncomplicated: Secondary | ICD-10-CM | POA: Diagnosis not present

## 2016-02-01 MED ORDER — TRAMADOL HCL 50 MG PO TABS: 100.0000 mg | ORAL_TABLET | Freq: Four times a day (QID) | ORAL | 0 refills | Status: DC | PRN

## 2016-02-01 MED ORDER — TRAMADOL HCL 50 MG PO TABS
100.0000 mg | ORAL_TABLET | Freq: Once | ORAL | Status: AC
  Administered 2016-02-01: 100 mg via ORAL
  Filled 2016-02-01: qty 2

## 2016-02-01 MED ORDER — AMOXICILLIN 500 MG PO CAPS: 1000.0000 mg | ORAL_CAPSULE | Freq: Two times a day (BID) | ORAL | 0 refills | Status: DC

## 2016-02-01 MED ORDER — AMOXICILLIN 500 MG PO CAPS
1000.0000 mg | ORAL_CAPSULE | Freq: Once | ORAL | Status: AC
  Administered 2016-02-01: 1000 mg via ORAL
  Filled 2016-02-01: qty 2

## 2016-02-01 NOTE — ED Triage Notes (Signed)
Pt. reports upper incisor pain with gum swelling / poor dentition onset this week unrelieved by OTC Ibuprofen .

## 2016-02-01 NOTE — ED Notes (Signed)
Pt has left side facial swelling around his gums. Pt reports fevers at home but denies checking his temperature with a thermometer. Pt denies chills at this time. Pt afebrile @ 99.6

## 2016-02-01 NOTE — ED Provider Notes (Addendum)
[facility] Provider Note   CSN: [id number] Arrival date & time: 02/01/16  2031  First Provider Contact:  First MD Initiated Contact with Patient 02/01/16 2201        History   Chief Complaint Chief Complaint  Patient presents with  . Dental Pain    HPI Maxwell Glover is a 35 y.o. male.  HPI Patient has developed pain and swelling in his left upper tooth. He indicates an area of swelling and tenderness at approximately the nasolabial fold on the left. He reports he's recurrently had problems with very bad teeth but it usually goes away. He reports today he has noted more swelling. He endorses subjective fever. History reviewed. No pertinent past medical history.  There are no active problems to display for this patient.   History reviewed. No pertinent surgical history.     Home Medications    Prior to Admission medications   Medication Sig Start Date End Date Taking? Authorizing Provider  amoxicillin (AMOXIL) 500 MG capsule Take 2 capsules (1,000 mg total) by mouth 2 (two) times daily. 02/01/16   Arby Barrette, MD  Cimetidine (ACID RELIEF PO) Take 1 tablet by mouth daily.    Historical Provider, MD  Multiple Vitamin (MULTIVITAMIN WITH MINERALS) TABS tablet Take 1 tablet by mouth daily.    Historical Provider, MD  omeprazole (PRILOSEC) 20 MG capsule Take 1 capsule (20 mg total) by mouth daily. 09/22/15   Laurence Spates, MD  Probiotic Product (DIGESTIVE ADVANTAGE PO) Take 1 tablet by mouth daily.    Historical Provider, MD  sucralfate (CARAFATE) 1 g tablet Take 1 tablet (1 g total) by mouth 4 (four) times daily -  with meals and at bedtime. 09/22/15   Laurence Spates, MD  traMADol (ULTRAM) 50 MG tablet Take 2 tablets (100 mg total) by mouth every 6 (six) hours as needed. 02/01/16   Arby Barrette, MD    Family History No family history on file.  Social History Social History  Substance Use Topics  . Smoking status: Current Every Day Smoker  .  Smokeless tobacco: Not on file  . Alcohol use Yes     Allergies   Review of patient's allergies indicates no known allergies.   Review of Systems Review of Systems Constitutional: Subjective fever and chills. No general malaise. ENT: No nasal congestion or drainage nor sore throat or difficulty swallowing Respiratory: No shortness of breath or cough  Physical Exam Updated Vital Signs BP (!) 172/119 (BP Location: Right Arm)   Pulse 104   Temp 99.5 F (37.5 C) (Oral)   Resp 21   Ht  (1.778 m)   Wt 209 lb 14.4 oz (95.2 kg)   SpO2 100%   BMI 30.12 kg/m   Physical Exam  Constitutional: He is oriented to person, place, and time. He appears well-developed and well-nourished. No distress.  HENT:  Head: Normocephalic and atraumatic.  Patient has mild swelling at the nasolabial fold on the left just superior to the canine. This area slightly tender. Intraoral examination shows extensive advanced dental decay. This is at multiple teeth. There is diffuse gingivitis. There is no focal fluctuance, swelling above the second incisor and canine in the area of the swelling. Posterior or first widely patent.  Neck: Neck supple.  Cardiovascular: Normal rate.   Pulmonary/Chest: Effort normal.  Neurological: He is alert and oriented to person, place, and time. Coordination normal.  Skin: Skin is warm and dry.  Psychiatric: He has a normal mood  and affect.     ED Treatments / Results  Labs (all labs ordered are listed, but only abnormal results are displayed) Labs Reviewed - No data to display  EKG  EKG Interpretation None       Radiology No results found.  Procedures Procedures (including critical care time)  Medications Ordered in ED Medications  amoxicillin (AMOXIL) capsule 1,000 mg (not administered)  traMADol (ULTRAM) tablet 100 mg (not administered)     Initial Impression / Assessment and Plan / ED Course  I have reviewed the triage vital signs and the nursing  notes.  Pertinent labs & imaging results that were available during my care of the patient were reviewed by me and considered in my medical decision making (see chart for details).  Clinical Course     Final Clinical Impressions(s) / ED Diagnoses   Final diagnoses:  Dental infection  Patient has extensive dental decay. He has developed a focus of small facial swelling and associated dental pain. This is consistent with an apical abscess. Patient will be started on amoxicillin and tramadol. He is counseled on the necessity of dental follow-up. Dental resource guide is provided. Patient denies any other medical problems. Patient is counseled on his hypertension. Review of EMR indicates that these chronically has some degree of diastolic hypertension. He is advised that there is a referral number for primary care physician and his discharge instructions and that it is very important not to leave hypertension untreated even though he is not experiencing symptoms.  New Prescriptions New Prescriptions   AMOXICILLIN (AMOXIL) 500 MG CAPSULE    Take 2 capsules (1,000 mg total) by mouth 2 (two) times daily.   TRAMADOL (ULTRAM) 50 MG TABLET    Take 2 tablets (100 mg total) by mouth every 6 (six) hours as needed.     Arby Barrette, MD 02/01/16 1638    Arby Barrette, MD 02/01/16 2223

## 2016-02-01 NOTE — ED Notes (Signed)
MD at bedside. 

## 2016-03-02 ENCOUNTER — Encounter (HOSPITAL_COMMUNITY): Payer: Self-pay | Admitting: Family Medicine

## 2016-03-02 ENCOUNTER — Ambulatory Visit (HOSPITAL_COMMUNITY)
Admission: EM | Admit: 2016-03-02 | Discharge: 2016-03-02 | Disposition: A | Discharge status: Home / Self Care | Payer: 59 | Attending: Family Medicine | Admitting: Family Medicine

## 2016-03-02 DIAGNOSIS — N4282 Prostatosis syndrome: Secondary | ICD-10-CM

## 2016-03-02 LAB — POCT URINALYSIS DIP (DEVICE)
BILIRUBIN URINE: NEGATIVE
Glucose, UA: NEGATIVE mg/dL
KETONES UR: NEGATIVE mg/dL
Leukocytes, UA: NEGATIVE
NITRITE: NEGATIVE
PH: 6 (ref 5.0–8.0)
PROTEIN: NEGATIVE mg/dL
Specific Gravity, Urine: >=1.03 (ref 1.005–1.030)
UROBILINOGEN UA: 0.2 mg/dL (ref 0.0–1.0)

## 2016-03-02 MED ORDER — DOXYCYCLINE HYCLATE 100 MG PO CAPS: 100.0000 mg | ORAL_CAPSULE | Freq: Two times a day (BID) | ORAL | 0 refills | Status: DC

## 2016-03-02 NOTE — ED Provider Notes (Signed)
[facility]    CSN: [id number] Arrival date & time: 03/02/16  [id number]  First Provider Contact:  None       History   Chief Complaint Chief Complaint  Patient presents with  . Abdominal Pain  . Urinary Retention    HPI Maxwell Glover is a 35 y.o. male.   The history is provided by the patient.  Abdominal Pain  Pain location:  Suprapubic Pain quality: burning and pressure   Pain radiates to:  Back Pain severity:  Moderate Onset quality:  Gradual Duration:  2 weeks Chronicity:  New Relieved by:  Nothing Worsened by:  Nothing Ineffective treatments:  None tried Associated symptoms: no chills, no fever, no hematuria, no nausea and no vomiting     History reviewed. No pertinent past medical history.  There are no active problems to display for this patient.   History reviewed. No pertinent surgical history.     Home Medications    Prior to Admission medications   Medication Sig Start Date End Date Taking? Authorizing Provider  amoxicillin (AMOXIL) 500 MG capsule Take 2 capsules (1,000 mg total) by mouth 2 (two) times daily. 02/01/16   Arby BarretteMarcy Pfeiffer, MD  Cimetidine (ACID RELIEF PO) Take 1 tablet by mouth daily.    Historical Provider, MD  Multiple Vitamin (MULTIVITAMIN WITH MINERALS) TABS tablet Take 1 tablet by mouth daily.    Historical Provider, MD  omeprazole (PRILOSEC) 20 MG capsule Take 1 capsule (20 mg total) by mouth daily. 09/22/15   [name], MD  Probiotic Product (DIGESTIVE ADVANTAGE PO) Take 1 tablet by mouth daily.    Historical Provider, MD  sucralfate (CARAFATE) 1 g tablet Take 1 tablet (1 g total) by mouth 4 (four) times daily -  with meals and at bedtime. 09/22/15   [name], MD  traMADol (ULTRAM) 50 MG tablet Take 2 tablets (100 mg total) by mouth every 6 (six) hours as needed. 02/01/16   Arby BarretteMarcy Pfeiffer, MD    Family History History reviewed. No pertinent family history.  Social History Social History    Substance Use Topics  . Smoking status: Current Every Day Smoker  . Smokeless tobacco: Never Used  . Alcohol use Yes     Allergies   Review of patient's allergies indicates no known allergies.   Review of Systems Review of Systems  Constitutional: Negative for chills and fever.  Gastrointestinal: Positive for abdominal pain. Negative for nausea and vomiting.  Genitourinary: Positive for difficulty urinating, flank pain and testicular pain. Negative for discharge, frequency, genital sores, hematuria, penile pain, penile swelling and scrotal swelling.  All other systems reviewed and are negative.    Physical Exam Triage Vital Signs ED Triage Vitals  Enc Vitals Group     BP 03/02/16 1915 (!) 161/102     Pulse Rate 03/02/16 1915 94     Resp 03/02/16 1915 15     Temp 03/02/16 1915 98.5 F (36.9 C)     Temp Source 03/02/16 1915 Oral     SpO2 03/02/16 1915 100 %     Weight --      Height --      Head Circumference --      Peak Flow --      Pain Score 03/02/16 1928 6     Pain Loc --      Pain Edu? --      Excl. in GC? --    No data found.   Updated Vital Signs  BP (!) 161/102 (BP Location: Left Arm)   Pulse 94   Temp 98.5 F (36.9 C) (Oral)   Resp 15   SpO2 100%   Visual Acuity Right Eye Distance:   Left Eye Distance:   Bilateral Distance:    Right Eye Near:   Left Eye Near:    Bilateral Near:     Physical Exam  Constitutional: He appears well-developed and well-nourished. No distress.  Abdominal: Soft. Bowel sounds are normal.  Genitourinary: Penis normal.  Skin: Skin is warm and dry.  Nursing note and vitals reviewed.    UC Treatments / Results  Labs (all labs ordered are listed, but only abnormal results are displayed) Labs Reviewed - No data to display U/a neg.  EKG  EKG Interpretation None       Radiology No results found.  Procedures Procedures (including critical care time)  Medications Ordered in UC Medications - No data to  display   Initial Impression / Assessment and Plan / UC Course  I have reviewed the triage vital signs and the nursing notes.  Pertinent labs & imaging results that were available during my care of the patient were reviewed by me and considered in my medical decision making (see chart for details).  Clinical Course      Final Clinical Impressions(s) / UC Diagnoses   Final diagnoses:  None    New Prescriptions New Prescriptions   No medications on file     Linna Hoff, MD 03/02/16 1943

## 2016-03-02 NOTE — ED Triage Notes (Signed)
Pt here for lower abd pain with urinary retention, penile discharge that is white and pressure in rectum. sts he was sexually active but was tested 1 month ago and negative. sts currently not sexually active.

## 2016-08-06 ENCOUNTER — Encounter (HOSPITAL_COMMUNITY): Payer: Self-pay | Admitting: Emergency Medicine

## 2016-08-06 ENCOUNTER — Ambulatory Visit (HOSPITAL_COMMUNITY)
Admission: EM | Admit: 2016-08-06 | Discharge: 2016-08-06 | Disposition: A | Discharge status: Home / Self Care | Payer: 59 | Attending: Emergency Medicine | Admitting: Emergency Medicine

## 2016-08-06 DIAGNOSIS — R110 Nausea: Secondary | ICD-10-CM | POA: Diagnosis not present

## 2016-08-06 DIAGNOSIS — R1013 Epigastric pain: Secondary | ICD-10-CM | POA: Diagnosis not present

## 2016-08-06 DIAGNOSIS — B349 Viral infection, unspecified: Secondary | ICD-10-CM

## 2016-08-06 MED ORDER — OMEPRAZOLE 20 MG PO CPDR: 20.0000 mg | DELAYED_RELEASE_CAPSULE | Freq: Every day | ORAL | 0 refills | Status: AC

## 2016-08-06 MED ORDER — ONDANSETRON 4 MG PO TBDP
ORAL_TABLET | ORAL | Status: AC
Start: 2016-08-06 — End: 2016-08-06
  Filled 2016-08-06: qty 1

## 2016-08-06 MED ORDER — GI COCKTAIL ~~LOC~~
30.0000 mL | Freq: Once | ORAL | Status: AC
  Administered 2016-08-06: 30 mL via ORAL

## 2016-08-06 MED ORDER — GI COCKTAIL ~~LOC~~
ORAL | Status: AC
  Filled 2016-08-06: qty 30

## 2016-08-06 MED ORDER — IBUPROFEN 800 MG PO TABS
800.0000 mg | ORAL_TABLET | Freq: Once | ORAL | Status: DC
Start: 2016-08-06 — End: 2016-08-06

## 2016-08-06 MED ORDER — ONDANSETRON 4 MG PO TBDP
4.0000 mg | ORAL_TABLET | Freq: Once | ORAL | Status: AC
  Administered 2016-08-06: 4 mg via ORAL

## 2016-08-06 MED ORDER — ONDANSETRON 4 MG PO TBDP: 4.0000 mg | ORAL_TABLET | Freq: Three times a day (TID) | ORAL | 0 refills | Status: AC | PRN

## 2016-08-06 NOTE — ED Provider Notes (Signed)
CSN: [id number]     Arrival date & time 08/06/16  1608 History   First MD Initiated Contact with Patient 08/06/16 1706     Chief Complaint  Patient presents with  . Abdominal Pain   (Consider location/radiation/quality/duration/timing/severity/associated sxs/prior Treatment) Patient c/o nausea and epigastric abdomen and chest pain which started yesterday.  He states he has pain in lower chest area.   The history is provided by the patient.  Abdominal Pain  Pain location:  Epigastric Pain quality: aching   Pain radiates to:  Epigastric region Pain severity:  Moderate Onset quality:  Sudden Duration:  1 day Timing:  Constant Chronicity:  New Relieved by:  Nothing Worsened by:  Nothing Ineffective treatments:  None tried Associated symptoms: chest pain and nausea     History reviewed. No pertinent past medical history. History reviewed. No pertinent surgical history. No family history on file. Social History  Substance Use Topics  . Smoking status: Current Every Day Smoker  . Smokeless tobacco: Never Used  . Alcohol use Yes    Review of Systems  Constitutional: Negative.   HENT: Negative.   Eyes: Negative.   Respiratory: Negative.   Cardiovascular: Positive for chest pain.  Gastrointestinal: Positive for abdominal pain and nausea.  Endocrine: Negative.   Genitourinary: Negative.   Musculoskeletal: Negative.   Allergic/Immunologic: Negative.   Hematological: Negative.   Psychiatric/Behavioral: Negative.     Allergies  Patient has no known allergies.  Home Medications   Prior to Admission medications   Medication Sig Start Date End Date Taking? Authorizing Provider  amoxicillin (AMOXIL) 500 MG capsule Take 2 capsules (1,000 mg total) by mouth 2 (two) times daily. Patient not taking: Reported on 08/06/2016 02/01/16   Arby BarretteMarcy Pfeiffer, MD  Cimetidine (ACID RELIEF PO) Take 1 tablet by mouth daily.    Historical Provider, MD  doxycycline (VIBRAMYCIN) 100 MG capsule  Take 1 capsule (100 mg total) by mouth 2 (two) times daily. Patient not taking: Reported on 08/06/2016 03/02/16   Linna HoffJames D Kindl, MD  Multiple Vitamin (MULTIVITAMIN WITH MINERALS) TABS tablet Take 1 tablet by mouth daily.    Historical Provider, MD  omeprazole (PRILOSEC) 20 MG capsule Take 1 capsule (20 mg total) by mouth daily. Patient not taking: Reported on 08/06/2016 09/22/15   [name], MD  omeprazole (PRILOSEC) 20 MG capsule Take 1 capsule (20 mg total) by mouth daily. 08/06/16   Deatra CanterWilliam J Billal Rollo, FNP  ondansetron (ZOFRAN ODT) 4 MG disintegrating tablet Take 1 tablet (4 mg total) by mouth every 8 (eight) hours as needed for nausea or vomiting. 08/06/16   Deatra CanterWilliam J Leola Fiore, FNP  Probiotic Product (DIGESTIVE ADVANTAGE PO) Take 1 tablet by mouth daily.    Historical Provider, MD  sucralfate (CARAFATE) 1 g tablet Take 1 tablet (1 g total) by mouth 4 (four) times daily -  with meals and at bedtime. Patient not taking: Reported on 08/06/2016 09/22/15   [name], MD  traMADol (ULTRAM) 50 MG tablet Take 2 tablets (100 mg total) by mouth every 6 (six) hours as needed. Patient not taking: Reported on 08/06/2016 02/01/16   Arby BarretteMarcy Pfeiffer, MD   Meds Ordered and Administered this Visit   Medications  ondansetron (ZOFRAN-ODT) disintegrating tablet 4 mg (4 mg Oral Given 08/06/16 1732)  gi cocktail (Maalox,Lidocaine,Donnatal) (30 mLs Oral Given 08/06/16 1758)    BP (!) 147/101 (BP Location: Right Arm)   Pulse 85   Temp 98.3 F (36.8 C) (Oral)   Resp 18  SpO2 100%  No data found.   Physical Exam  Constitutional: He is oriented to person, place, and time. He appears well-developed and well-nourished.  HENT:  Head: Normocephalic and atraumatic.  Right Ear: External ear normal.  Left Ear: External ear normal.  Mouth/Throat: Oropharynx is clear and moist.  Eyes: Conjunctivae and EOM are normal. Pupils are equal, round, and reactive to light.  Neck: Normal range of motion. Neck  supple.  Cardiovascular: Normal rate, regular rhythm and normal heart sounds.   Pulmonary/Chest: Effort normal and breath sounds normal.  Abdominal: Soft. Bowel sounds are normal. There is tenderness.  Epigastric tenderness  Neurological: He is alert and oriented to person, place, and time.  Nursing note and vitals reviewed.   Urgent Care Course     Procedures (including critical care time)  Labs Review Labs Reviewed - No data to display  Imaging Review No results found.   Visual Acuity Review  Right Eye Distance:   Left Eye Distance:   Bilateral Distance:    Right Eye Near:   Left Eye Near:    Bilateral Near:     EKG - NSR w/o acute ST-T changes.     MDM   1. Nausea   2. Epigastric abdominal pain   3. Viral syndrome    Zofran ODT 4mg  one po now EKG  - NSR w/o acute ST-T changes GI cocktail Prilosec 20mg  one po qd #14 Zofran ODT 4mg  one po tid prn #20  Push po fluids, rest, tylenol and motrin otc prn as directed for fever, arthralgias, and myalgias.  Follow up prn if sx's continue or persist.    Deatra Canter, FNP 08/06/16 [phone]

## 2016-08-06 NOTE — ED Triage Notes (Signed)
Patient has epigastric pain and nausea that started yesterday.  Last bm was this morning, semi-soft stool.  Patient has a cough that started today

## 2016-12-20 ENCOUNTER — Encounter (HOSPITAL_COMMUNITY): Payer: Self-pay | Admitting: Emergency Medicine

## 2016-12-20 ENCOUNTER — Ambulatory Visit (HOSPITAL_COMMUNITY)
Admission: EM | Admit: 2016-12-20 | Discharge: 2016-12-20 | Disposition: A | Discharge status: Home / Self Care | Payer: 59 | Attending: Internal Medicine | Admitting: Internal Medicine

## 2016-12-20 DIAGNOSIS — S39012A Strain of muscle, fascia and tendon of lower back, initial encounter: Secondary | ICD-10-CM | POA: Diagnosis not present

## 2016-12-20 DIAGNOSIS — M545 Low back pain, unspecified: Secondary | ICD-10-CM

## 2016-12-20 HISTORY — DX: Essential (primary) hypertension: I10

## 2016-12-20 LAB — POCT URINALYSIS DIP (DEVICE)
Bilirubin Urine: NEGATIVE
GLUCOSE, UA: NEGATIVE mg/dL
Ketones, ur: NEGATIVE mg/dL
Leukocytes, UA: NEGATIVE
Nitrite: NEGATIVE
PH: 5.5 (ref 5.0–8.0)
PROTEIN: NEGATIVE mg/dL
Specific Gravity, Urine: 1.02 (ref 1.005–1.030)
UROBILINOGEN UA: 0.2 mg/dL (ref 0.0–1.0)

## 2016-12-20 NOTE — Discharge Instructions (Signed)
The area pain on the left side of your back and buttock is due to muscle strain/pain. It is tender to touch and when stretched it hurts. Try to avoid those movements that exacerbates the pain. Apply heat over the area pain and take ibuprofen 600 mg every 6 hours as needed for pain. She continues to have sensation of incomplete emptying of the bladder after you urinate call the urologist listed on this page.

## 2016-12-20 NOTE — ED Provider Notes (Signed)
CSN: [id number]     Arrival date & time 12/20/16  1554 History   First MD Initiated Contact with Patient 12/20/16 1618     Chief Complaint  Patient presents with  . Rectal Pain  . Back Pain  . Insufficient Bladder Emptying   (Consider location/radiation/quality/duration/timing/severity/associated sxs/prior Treatment) 36 year old male presents with a 5 to six-day history of pain in the left low back that radiates to the left buttock and proximal posterior thigh. It is worse with prolonged sitting, movement and bending forward. Denies any known injury. His second complaint is that of feeling as though his bladder has not been completely emptied after voiding. No dysuria or frequency. He states that he was working in a hot environment yesterday and had transient nausea. He went home and laid down and felt better. Denies known fevers. Note that the patient denies rectal pain to me. His reference was left buttock pain that radiated to the left lower back. He denies actually having any sort of rectal pain, bleeding, swelling or other symptoms.      Past Medical History:  Diagnosis Date  . Hypertension    History reviewed. No pertinent surgical history. History reviewed. No pertinent family history. Social History  Substance Use Topics  . Smoking status: Current Every Day Smoker    Packs/day: 0.25  . Smokeless tobacco: Never Used  . Alcohol use Yes     Comment: weekends    Review of Systems  Constitutional: Negative.   HENT: Negative.   Respiratory: Negative.   Cardiovascular: Negative for chest pain and leg swelling.  Gastrointestinal: Positive for nausea. Negative for abdominal pain.  Genitourinary: Negative for difficulty urinating, discharge, dysuria and frequency.       As per history of present illness  Musculoskeletal: Positive for back pain and myalgias.  Skin: Negative.   Neurological: Negative.     Allergies  Patient has no known allergies.  Home Medications    Prior to Admission medications   Medication Sig Start Date End Date Taking? Authorizing Provider  Cimetidine (ACID RELIEF PO) Take 1 tablet by mouth daily.    [provider]  Multiple Vitamin (MULTIVITAMIN WITH MINERALS) TABS tablet Take 1 tablet by mouth daily.    [provider]  omeprazole (PRILOSEC) 20 MG capsule Take 1 capsule (20 mg total) by mouth daily. Patient not taking: Reported on 08/06/2016 09/22/15   Little, Ambrose Finlandachel Morgan, MD  omeprazole (PRILOSEC) 20 MG capsule Take 1 capsule (20 mg total) by mouth daily. 08/06/16   Deatra Canterxford, William J, FNP  ondansetron (ZOFRAN ODT) 4 MG disintegrating tablet Take 1 tablet (4 mg total) by mouth every 8 (eight) hours as needed for nausea or vomiting. 08/06/16   Deatra Canterxford, William J, FNP  Probiotic Product (DIGESTIVE ADVANTAGE PO) Take 1 tablet by mouth daily.    [provider]   Meds Ordered and Administered this Visit  Medications - No data to display  BP (!) 158/111 (BP Location: Right Arm)   Pulse 100   Temp 98.8 F (37.1 C) (Oral)   SpO2 99%  No data found.   Physical Exam  Constitutional: He appears well-developed and well-nourished. No distress.  Neck: Neck supple.  Cardiovascular: Normal rate, regular rhythm and normal heart sounds.   Pulmonary/Chest: Effort normal and breath sounds normal. No respiratory distress.  Abdominal: Soft. There is no tenderness.  Musculoskeletal: He exhibits no edema.  Tenderness to the left para lumbosacral musculature and upper buttock. This reproduces the pain for which the patient  resents and complains of. No spinal tenderness. No discoloration, swelling.  Neurological: He is alert.  Skin: Skin is warm and dry.  Psychiatric: He has a normal mood and affect.  Nursing note and vitals reviewed.   Urgent Care Course     Procedures (including critical care time)  Labs Review Labs Reviewed  POCT URINALYSIS DIP (DEVICE) - Abnormal; Notable for the following:       Result  Value   Hgb urine dipstick SMALL (*)    All other components within normal limits    Imaging Review No results found.   Visual Acuity Review  Right Eye Distance:   Left Eye Distance:   Bilateral Distance:    Right Eye Near:   Left Eye Near:    Bilateral Near:         MDM   1. Acute left-sided low back pain without sciatica   2. Strain of lumbar region, initial encounter    The area pain on the left side of your back and buttock is due to muscle strain/pain. It is tender to touch and when stretched it hurts. Try to avoid those movements that exacerbates the pain. Apply heat over the area pain and take ibuprofen 600 mg every 6 hours as needed for pain. She continues to have sensation of incomplete emptying of the bladder after you urinate call the urologist listed on this page.     Hayden Rasmussen, NP 12/20/16 1652

## 2016-12-20 NOTE — ED Triage Notes (Signed)
Pt reports rectal pain and left sided lumbar pain not associated with BM's but with bending and sitting.  He also reports not feeling like he is emptying his bladder all the way when he urinates.  Pt denies any blood in his urine or discharge, but has noticed an odor to his urine in the mornings and particles in the urine.

## 2017-11-29 IMAGING — DX DG CHEST 2V
2 series · 2 of 2 positions shown · non-contrast
Comparison: Chest x-ray dated 07/25/2010.

CLINICAL DATA: Centralized chest pain today with some shortness of
breath. Arm pain.

EXAM:
CHEST  2 VIEW

[chest pa]
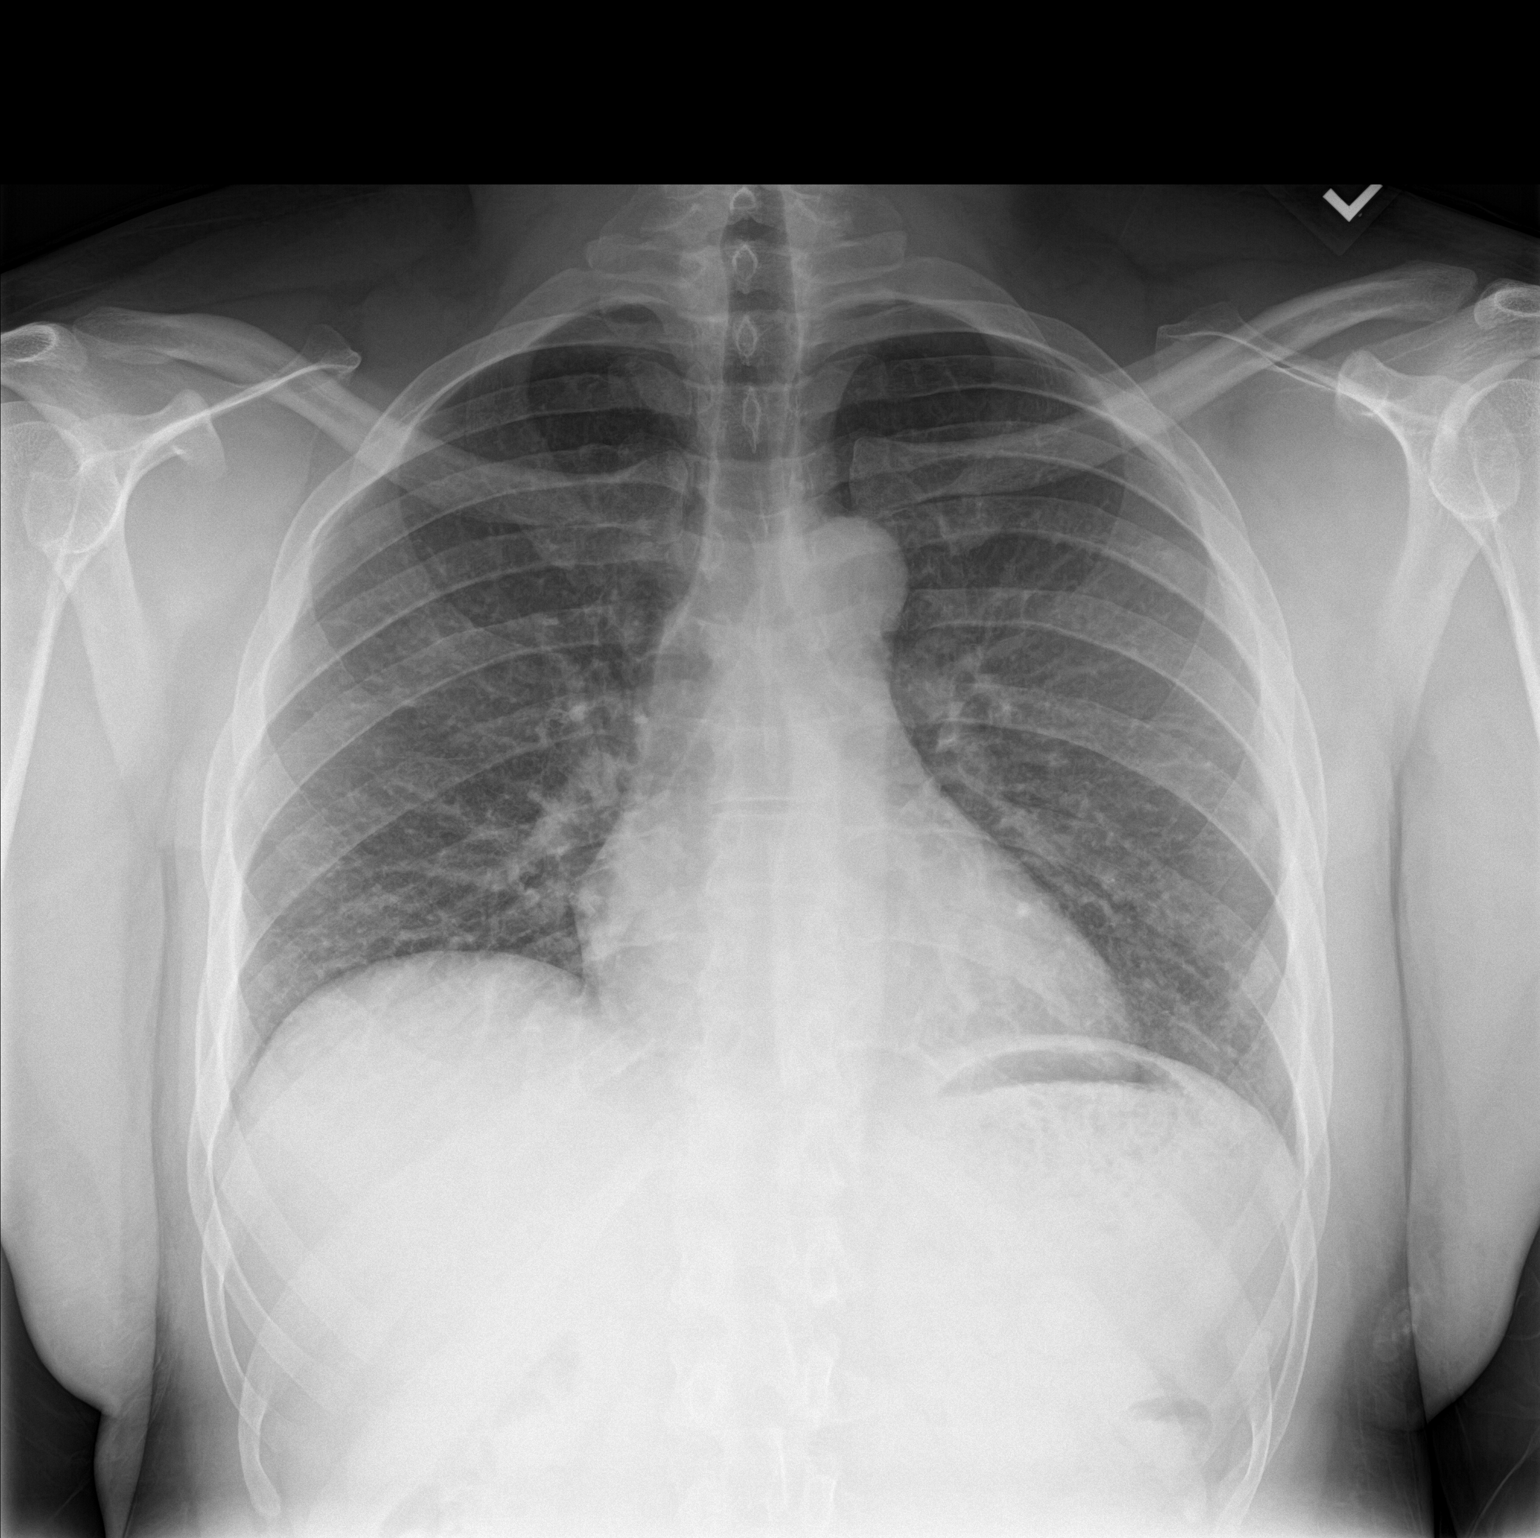

[chest lat]
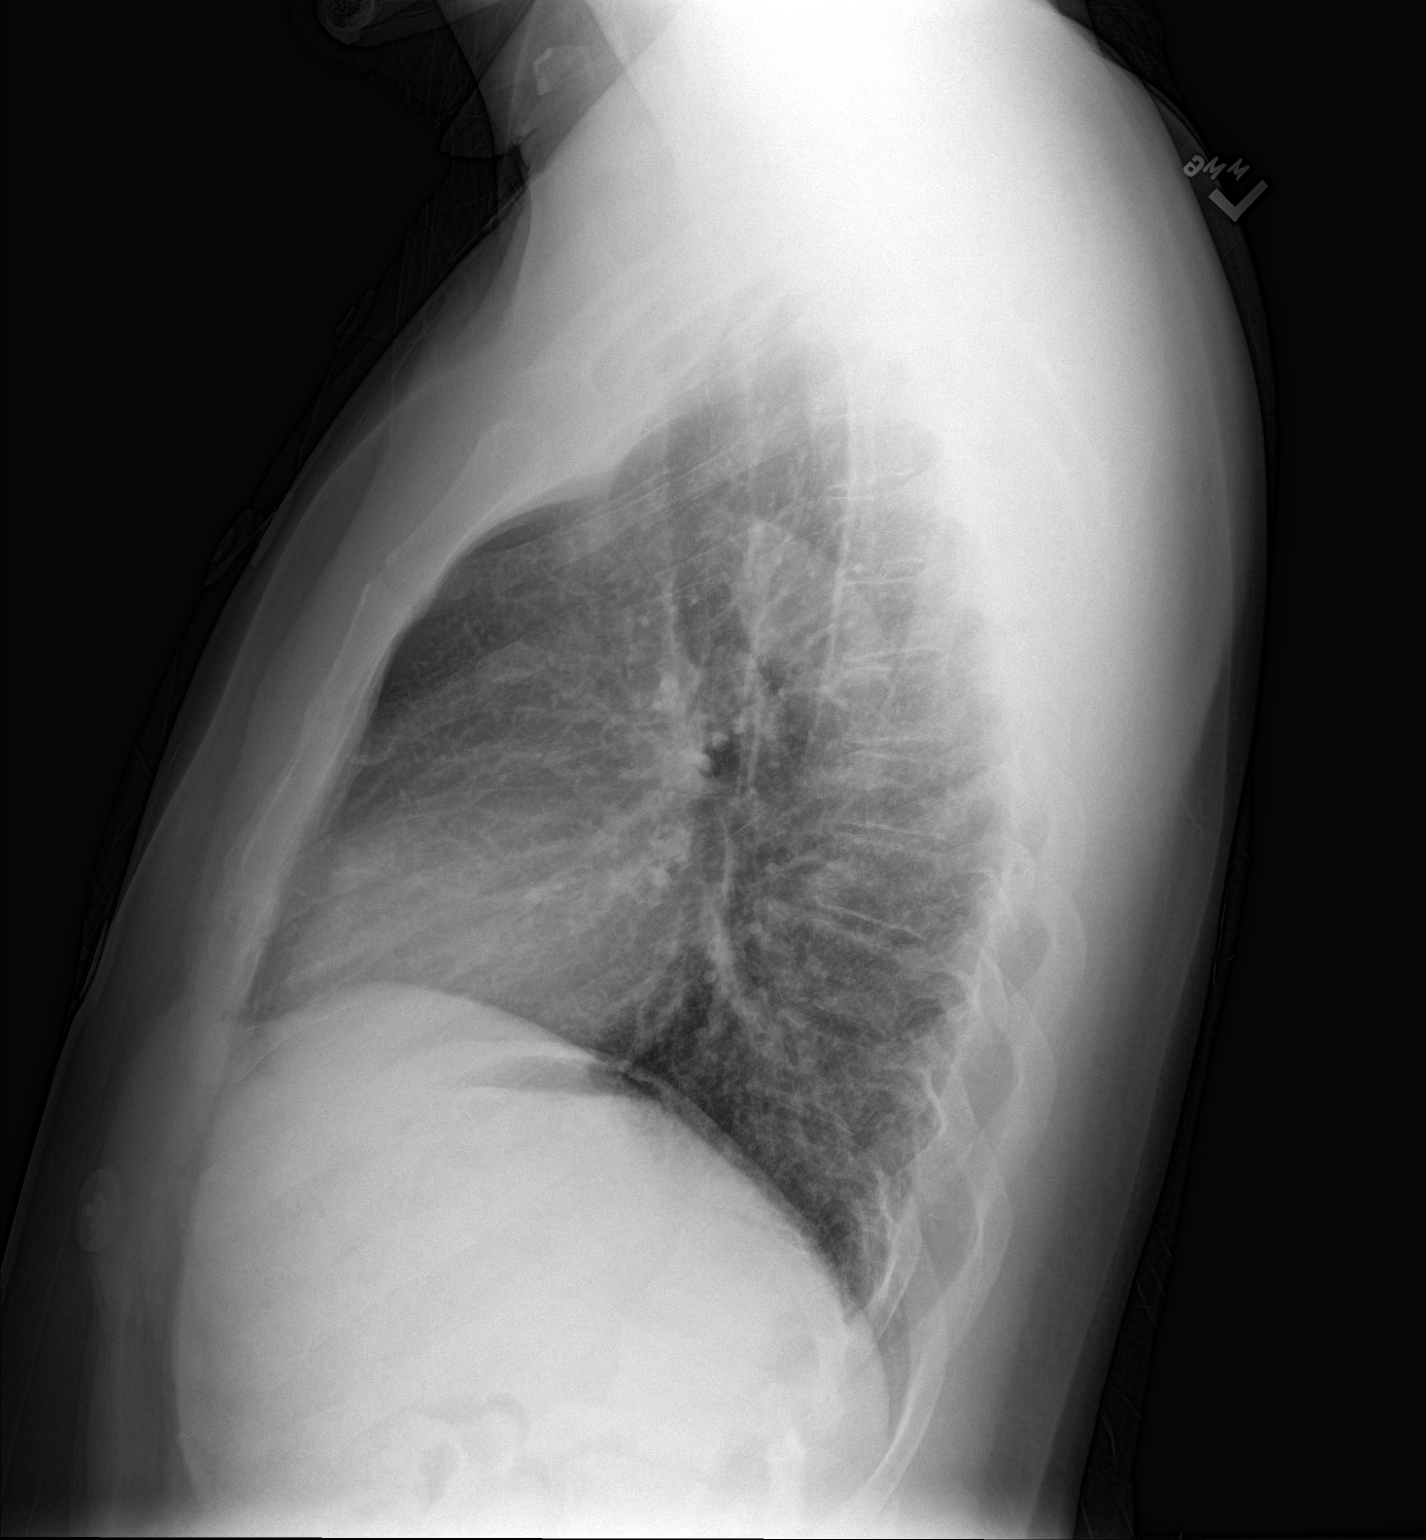

[2 of 2 positions shown; findings below may reference images not displayed]

FINDINGS: Study is slightly hypoinspiratory with crowding of the perihilar and
bibasilar bronchovascular markings. Given the low lung volumes,
lungs are clear. No confluent opacity to suggest a developing
pneumonia. No pleural effusion or pneumothorax seen.

Osseous and soft tissue structures about the chest are unremarkable.
IMPRESSION: No evidence of acute cardiopulmonary abnormality.

## 2018-11-10 ENCOUNTER — Encounter (HOSPITAL_COMMUNITY): Payer: Self-pay

## 2018-11-10 ENCOUNTER — Other Ambulatory Visit: Payer: Self-pay

## 2018-11-10 ENCOUNTER — Ambulatory Visit (HOSPITAL_COMMUNITY)
Admission: EM | Admit: 2018-11-10 | Discharge: 2018-11-10 | Disposition: A | Discharge status: Home / Self Care | Payer: 59 | Attending: Physician Assistant | Admitting: Physician Assistant

## 2018-11-10 DIAGNOSIS — B349 Viral infection, unspecified: Secondary | ICD-10-CM | POA: Diagnosis not present

## 2018-11-10 MED ORDER — IPRATROPIUM BROMIDE 0.06 % NA SOLN: 2.0000 | Freq: Four times a day (QID) | NASAL | 0 refills | Status: AC

## 2018-11-10 MED ORDER — FLUTICASONE PROPIONATE 50 MCG/ACT NA SUSP: 2.0000 | Freq: Every day | NASAL | 0 refills | Status: AC

## 2018-11-10 MED ORDER — BENZONATATE 100 MG PO CAPS: 100.0000 mg | ORAL_CAPSULE | Freq: Three times a day (TID) | ORAL | 0 refills | Status: AC

## 2018-11-10 NOTE — ED Provider Notes (Signed)
[facility]    CSN: [id number] Arrival date & time: 11/10/18  0815     History   Chief Complaint Chief Complaint  Patient presents with  . Cough    HPI Maxwell Glover is a 38 y.o. male.   38 year old male comes in for 4-day history of URI symptoms.  Has had nasal congestion, cough, sore throat.  Denies fever, chills, night sweats.  Has had nausea and body aches that has since resolved.  Denies chest pain, shortness of breath, wheezing.  Has intermittent abdominal cramping that has since resolved.  Denies diarrhea, constipation.  Current everyday smoker.  Works at the [employer], no obvious sick contact.     Past Medical History:  Diagnosis Date  . Hypertension     There are no active problems to display for this patient.   History reviewed. No pertinent surgical history.     Home Medications    Prior to Admission medications   Medication Sig Start Date End Date Taking? Authorizing Provider  benzonatate (TESSALON) 100 MG capsule Take 1 capsule (100 mg total) by mouth every 8 (eight) hours. 11/10/18   Cathie HoopsYu, Amy V, PA-C  Cimetidine (ACID RELIEF PO) Take 1 tablet by mouth daily.    [provider]  fluticasone (FLONASE) 50 MCG/ACT nasal spray Place 2 sprays into both nostrils daily. 11/10/18   Cathie HoopsYu, Amy V, PA-C  ipratropium (ATROVENT) 0.06 % nasal spray Place 2 sprays into both nostrils 4 (four) times daily. 11/10/18   Cathie HoopsYu, Amy V, PA-C  Multiple Vitamin (MULTIVITAMIN WITH MINERALS) TABS tablet Take 1 tablet by mouth daily.    [provider]  omeprazole (PRILOSEC) 20 MG capsule Take 1 capsule (20 mg total) by mouth daily. Patient not taking: Reported on 08/06/2016 09/22/15   Little, Ambrose Finlandachel Morgan, MD  omeprazole (PRILOSEC) 20 MG capsule Take 1 capsule (20 mg total) by mouth daily. Patient not taking: Reported on 11/10/2018 08/06/16   Deatra Canterxford, William J, FNP  ondansetron (ZOFRAN ODT) 4 MG disintegrating tablet Take 1 tablet (4 mg total) by mouth every 8  (eight) hours as needed for nausea or vomiting. Patient not taking: Reported on 11/10/2018 08/06/16   Deatra Canterxford, William J, FNP  Probiotic Product (DIGESTIVE ADVANTAGE PO) Take 1 tablet by mouth daily.    [provider]    Family History History reviewed. No pertinent family history.  Social History Social History   Tobacco Use  . Smoking status: Current Every Day Smoker    Packs/day: 0.25  . Smokeless tobacco: Never Used  Substance Use Topics  . Alcohol use: Yes    Comment: weekends  . Drug use: No     Allergies   Patient has no known allergies.   Review of Systems Review of Systems  Reason unable to perform ROS: See HPI as above.     Physical Exam Triage Vital Signs ED Triage Vitals  Enc Vitals Group     BP 11/10/18 0831 (!) 173/115     Pulse Rate 11/10/18 0831 (!) 103     Resp 11/10/18 0831 16     Temp 11/10/18 0831 98.4 F (36.9 C)     Temp Source 11/10/18 0831 Oral     SpO2 11/10/18 0831 100 %     Weight 11/10/18 0834 250 lb (113.4 kg)     Height --      Head Circumference --      Peak Flow --      Pain Score 11/10/18 0834 1  Pain Loc --      Pain Edu? --      Excl. in GC? --    No data found.  Updated Vital Signs BP (!) 173/115 (BP Location: Left Arm) Comment: Notified T Staley  Pulse (!) 103   Temp 98.4 F (36.9 C) (Oral)   Resp 16   Wt 250 lb (113.4 kg)   SpO2 100%   BMI 35.87 kg/m   Physical Exam Constitutional:      General: He is not in acute distress.    Appearance: Normal appearance. He is not ill-appearing, toxic-appearing or diaphoretic.  HENT:     Head: Normocephalic and atraumatic.     Mouth/Throat:     Mouth: Mucous membranes are moist.     Pharynx: Oropharynx is clear. Uvula midline.  Neck:     Musculoskeletal: Normal range of motion and neck supple.  Cardiovascular:     Rate and Rhythm: Normal rate and regular rhythm.     Heart sounds: Normal heart sounds. No murmur. No friction rub. No gallop.   Pulmonary:      Effort: Pulmonary effort is normal. No accessory muscle usage, prolonged expiration, respiratory distress or retractions.     Comments: Speaking in full sentences without difficulty. Lungs clear to auscultation without adventitious lung sounds. Neurological:     General: No focal deficit present.     Mental Status: He is alert and oriented to person, place, and time.    UC Treatments / Results  Labs (all labs ordered are listed, but only abnormal results are displayed) Labs Reviewed - No data to display  EKG None  Radiology No results found.  Procedures Procedures (including critical care time)  Medications Ordered in UC Medications - No data to display  Initial Impression / Assessment and Plan / UC Course  I have reviewed the triage vital signs and the nursing notes.  Pertinent labs & imaging results that were available during my care of the patient were reviewed by me and considered in my medical decision making (see chart for details).    Patient speaking in full sentences without respiratory distress. Afebrile without antipyretic in last 8 hours. No tachycardia, tachypnea. Lungs CTAB. COVID testing limited to inpatient. Given history and exam, will have patient self quarantine. Instructions on when to end quarantine, family member isolation discussed, and resources provided. Symptomatic treatment discussed. Return precautions given. Patient expresses understanding and agrees to plan.  Final Clinical Impressions(s) / UC Diagnoses   Final diagnoses:  Viral illness   ED Prescriptions    Medication Sig Dispense Auth. Provider   benzonatate (TESSALON) 100 MG capsule Take 1 capsule (100 mg total) by mouth every 8 (eight) hours. 21 capsule Yu, Amy V, PA-C   ipratropium (ATROVENT) 0.06 % nasal spray Place 2 sprays into both nostrils 4 (four) times daily. 15 mL Yu, Amy V, PA-C   fluticasone (FLONASE) 50 MCG/ACT nasal spray Place 2 sprays into both nostrils daily. 1 g Edwinna Areola V, PA-C 11/10/18 0930

## 2018-11-10 NOTE — ED Notes (Signed)
Patient verbalizes understanding of discharge instructions. Opportunity for questioning and answers were provided. Patient discharged from [facility] by RN.  

## 2018-11-10 NOTE — Discharge Instructions (Addendum)
As discussed, cannot rule out COVID. Currently, no alarming signs.  Tessalon for cough. Start flonase, atrovent nasal spray for nasal congestion/drainage. You can use over the counter nasal saline rinse such as neti pot for nasal congestion. Keep hydrated, your urine should be clear to pale yellow in color.  I would like you to quarantine for 7 days since symptoms onset AND >72 hours without fever, and improved respiratory symptoms. Continue to monitor, you can call COVID hotline [phone]) or use [facility] online if symptoms worsens to determine where you should seek care. If experiencing shortness of breath, trouble breathing, call 911 and provide them with your current situation.

## 2018-11-10 NOTE — ED Triage Notes (Signed)
Pt cc he has a scratchy  throat and cough. Pt tried Sudafed.. Pt state he has congestion. This has been going on since Friday.
